# Patient Record
Sex: Male | Born: 1995 | Race: Black or African American | Hispanic: No | Marital: Single
Health system: Southern US, Community
[De-identification: ages and names within clinical notes are randomized; demographics above are authoritative.]

---

## 2017-07-10 ENCOUNTER — Encounter (HOSPITAL_COMMUNITY): Payer: Self-pay | Admitting: Emergency Medicine

## 2017-07-10 ENCOUNTER — Emergency Department (HOSPITAL_COMMUNITY)
Admission: EM | Admit: 2017-07-10 | Discharge: 2017-07-10 | Disposition: A | Discharge status: Home / Self Care | Payer: Medicaid Other | Attending: Emergency Medicine | Admitting: Emergency Medicine

## 2017-07-10 ENCOUNTER — Other Ambulatory Visit: Payer: Self-pay

## 2017-07-10 DIAGNOSIS — Z5321 Procedure and treatment not carried out due to patient leaving prior to being seen by health care provider: Secondary | ICD-10-CM | POA: Insufficient documentation

## 2017-07-10 DIAGNOSIS — M79602 Pain in left arm: Secondary | ICD-10-CM | POA: Diagnosis not present

## 2017-07-10 NOTE — ED Triage Notes (Signed)
Pt reports that when he left work his right arm began to hurt, no obvious injuries to the arm.  No swelling noted at this time.   He does "cut meat" for a living.

## 2019-05-07 ENCOUNTER — Encounter: Payer: Self-pay | Admitting: Cardiovascular Disease

## 2021-02-17 ENCOUNTER — Other Ambulatory Visit: Payer: Self-pay

## 2021-02-17 ENCOUNTER — Encounter (HOSPITAL_COMMUNITY): Payer: Self-pay | Admitting: *Deleted

## 2021-02-17 ENCOUNTER — Emergency Department (HOSPITAL_COMMUNITY): Payer: Self-pay

## 2021-02-17 ENCOUNTER — Emergency Department (HOSPITAL_COMMUNITY)
Admission: EM | Admit: 2021-02-17 | Discharge: 2021-02-17 | Disposition: A | Discharge status: Home / Self Care | Payer: Self-pay | Attending: Emergency Medicine | Admitting: Emergency Medicine

## 2021-02-17 DIAGNOSIS — R509 Fever, unspecified: Secondary | ICD-10-CM

## 2021-02-17 DIAGNOSIS — J09X2 Influenza due to identified novel influenza A virus with other respiratory manifestations: Secondary | ICD-10-CM | POA: Insufficient documentation

## 2021-02-17 DIAGNOSIS — J101 Influenza due to other identified influenza virus with other respiratory manifestations: Secondary | ICD-10-CM

## 2021-02-17 DIAGNOSIS — Z20822 Contact with and (suspected) exposure to covid-19: Secondary | ICD-10-CM | POA: Insufficient documentation

## 2021-02-17 LAB — RESP PANEL BY RT-PCR (FLU A&B, COVID) ARPGX2
Influenza A by PCR: POSITIVE — AB
Influenza B by PCR: NEGATIVE
SARS Coronavirus 2 by RT PCR: NEGATIVE

## 2021-02-17 MED ORDER — OSELTAMIVIR PHOSPHATE 75 MG PO CAPS: 75.0000 mg | ORAL_CAPSULE | Freq: Two times a day (BID) | ORAL | 0 refills | Status: AC

## 2021-02-17 MED ORDER — ACETAMINOPHEN 325 MG PO TABS
650.0000 mg | ORAL_TABLET | Freq: Once | ORAL | Status: AC | PRN
  Administered 2021-02-17: 650 mg via ORAL
  Filled 2021-02-17: qty 2

## 2021-02-17 NOTE — ED Triage Notes (Signed)
Pt arrived with [facility] for fever, cough, cp when coughing, and headache since Wednesday night. Denies covid exposure

## 2021-02-17 NOTE — ED Notes (Signed)
Pt noted to have bp readings 81 systolic, reports feeling lightheaded.

## 2021-02-17 NOTE — Discharge Instructions (Addendum)
You are positive for flu A.  Continue taking Tylenol and Motrin for fever control as discussed.  I have prescribed Tamiflu for you to take to decrease the duration of your symptoms.  Drink plenty of fluids. Water and/or Gatorade zero. If you develop nausea vomiting and are unable to keep any food down please return to the emergency room.

## 2021-02-17 NOTE — ED Provider Notes (Signed)
[facility] EMERGENCY DEPARTMENT Provider Note   CSN: [id number] Arrival date & time: 02/17/21  8338     History Chief Complaint  Patient presents with   Fever    Joe Harding is a 25 y.o. male.  25 year old male presents today for evaluation of fever and chest pain of 2-day duration.  Patient reports Wednesday night he had symptom onset with cough and fever have persistent since and yesterday developed chest pain causing him to present to the emergency room.  He has tried over-the-counter ibuprofen without much relief.  Denies nausea, vomiting, abdominal pain, shortness of breath, lightheadedness.  Reports he he has good appetite and has been drinking adequate fluid.  Reports his chest pain is worse with movement and cough.  Denies constant chest pain.  Chest pain does not radiate anywhere.  He denies recent known sick contacts.  The history is provided by the patient. No language interpreter was used.      History reviewed. No pertinent past medical history.  There are no problems to display for this patient.   History reviewed. No pertinent surgical history.     No family history on file.     Home Medications Prior to Admission medications   Medication Sig Start Date End Date Taking? Authorizing Provider  oseltamivir (TAMIFLU) 75 MG capsule Take 1 capsule (75 mg total) by mouth every 12 (twelve) hours. 02/17/21  Yes Marita Kansas, PA-C    Allergies    Patient has no known allergies.  Review of Systems   Review of Systems  Constitutional:  Positive for chills and fever. Negative for activity change, appetite change and fatigue.  HENT:  Negative for congestion and sore throat.   Eyes:  Negative for visual disturbance.  Respiratory:  Negative for shortness of breath.   Cardiovascular:  Positive for chest pain (With cough and motion). Negative for palpitations.  Gastrointestinal:  Negative for abdominal pain, diarrhea, nausea and vomiting.   Neurological:  Negative for weakness and light-headedness.  All other systems reviewed and are negative.  Physical Exam Updated Vital Signs BP 99/61   Pulse 91   Temp (!) 101.7 F (38.7 C)   Resp 17   SpO2 96%   Physical Exam Vitals and nursing note reviewed.  Constitutional:      General: He is not in acute distress.    Appearance: Normal appearance. He is not ill-appearing.  HENT:     Head: Normocephalic and atraumatic.     Nose: Nose normal.  Eyes:     General: No scleral icterus.    Extraocular Movements: Extraocular movements intact.     Conjunctiva/sclera: Conjunctivae normal.  Cardiovascular:     Rate and Rhythm: Regular rhythm. Tachycardia present.     Pulses: Normal pulses.     Heart sounds: Normal heart sounds.  Pulmonary:     Effort: Pulmonary effort is normal. No respiratory distress.     Breath sounds: Normal breath sounds. No wheezing or rales.  Abdominal:     General: There is no distension.     Tenderness: There is no abdominal tenderness.  Musculoskeletal:        General: Normal range of motion.     Cervical back: Normal range of motion.  Skin:    General: Skin is warm and dry.  Neurological:     General: No focal deficit present.     Mental Status: He is alert. Mental status is at baseline.    ED Results / Procedures / Treatments  Labs (all labs ordered are listed, but only abnormal results are displayed) Labs Reviewed  RESP PANEL BY RT-PCR (FLU A&B, COVID) ARPGX2 - Abnormal; Notable for the following components:      Result Value   Influenza A by PCR POSITIVE (*)    All other components within normal limits    EKG None  Radiology DG Chest Port 1 View  Result Date: 02/17/2021 CLINICAL DATA:  Fever EXAM: PORTABLE CHEST 1 VIEW COMPARISON:  None. FINDINGS: Normal mediastinum and cardiac silhouette. Normal pulmonary vasculature. No evidence of effusion, infiltrate, or pneumothorax. No acute bony abnormality. IMPRESSION: Normal chest  radiograph. Electronically Signed   By: Genevive Bi M.D.   On: 02/17/2021 07:28    Procedures Procedures   Medications Ordered in ED Medications  acetaminophen (TYLENOL) tablet 650 mg (650 mg Oral Given 02/17/21 8756)    ED Course  I have reviewed the triage vital signs and the nursing notes.  Pertinent labs & imaging results that were available during my care of the patient were reviewed by me and considered in my medical decision making (see chart for details).    MDM Rules/Calculators/A&P                           25 year old male presents today for evaluation of cough, fever, chest pain.  EKG with sinus tachycardia without acute ischemic changes.  Patient tested positive for influenza A.  Fever improved following Tylenol.  Patient is comfortable on exam.  Given his presentation chest pain is unlikely to be ACS.  Is normal EKG and worse with cough and exertion. Without nausea and able to tolerate p.o. fluids.  Discussed supportive care with Tylenol, Motrin for fever control, and adequate fluid intake.  We will send in Tamiflu given he is within 48-hour window of symptom onset.  Patient voices understanding and is in agreement with plan.  Final Clinical Impression(s) / ED Diagnoses Final diagnoses:  Influenza A    Rx / DC Orders ED Discharge Orders          Ordered    oseltamivir (TAMIFLU) 75 MG capsule  Every 12 hours        02/17/21 0811             Marita Kansas, PA-C 02/17/21 4332    Virgina Norfolk, DO 02/17/21 [phone]

## 2022-11-15 IMAGING — DX DG CHEST 1V PORT
1 series · 1 of 1 positions shown · non-contrast
Comparison: None.

CLINICAL DATA: Fever

EXAM:
PORTABLE CHEST 1 VIEW

[chest ap]
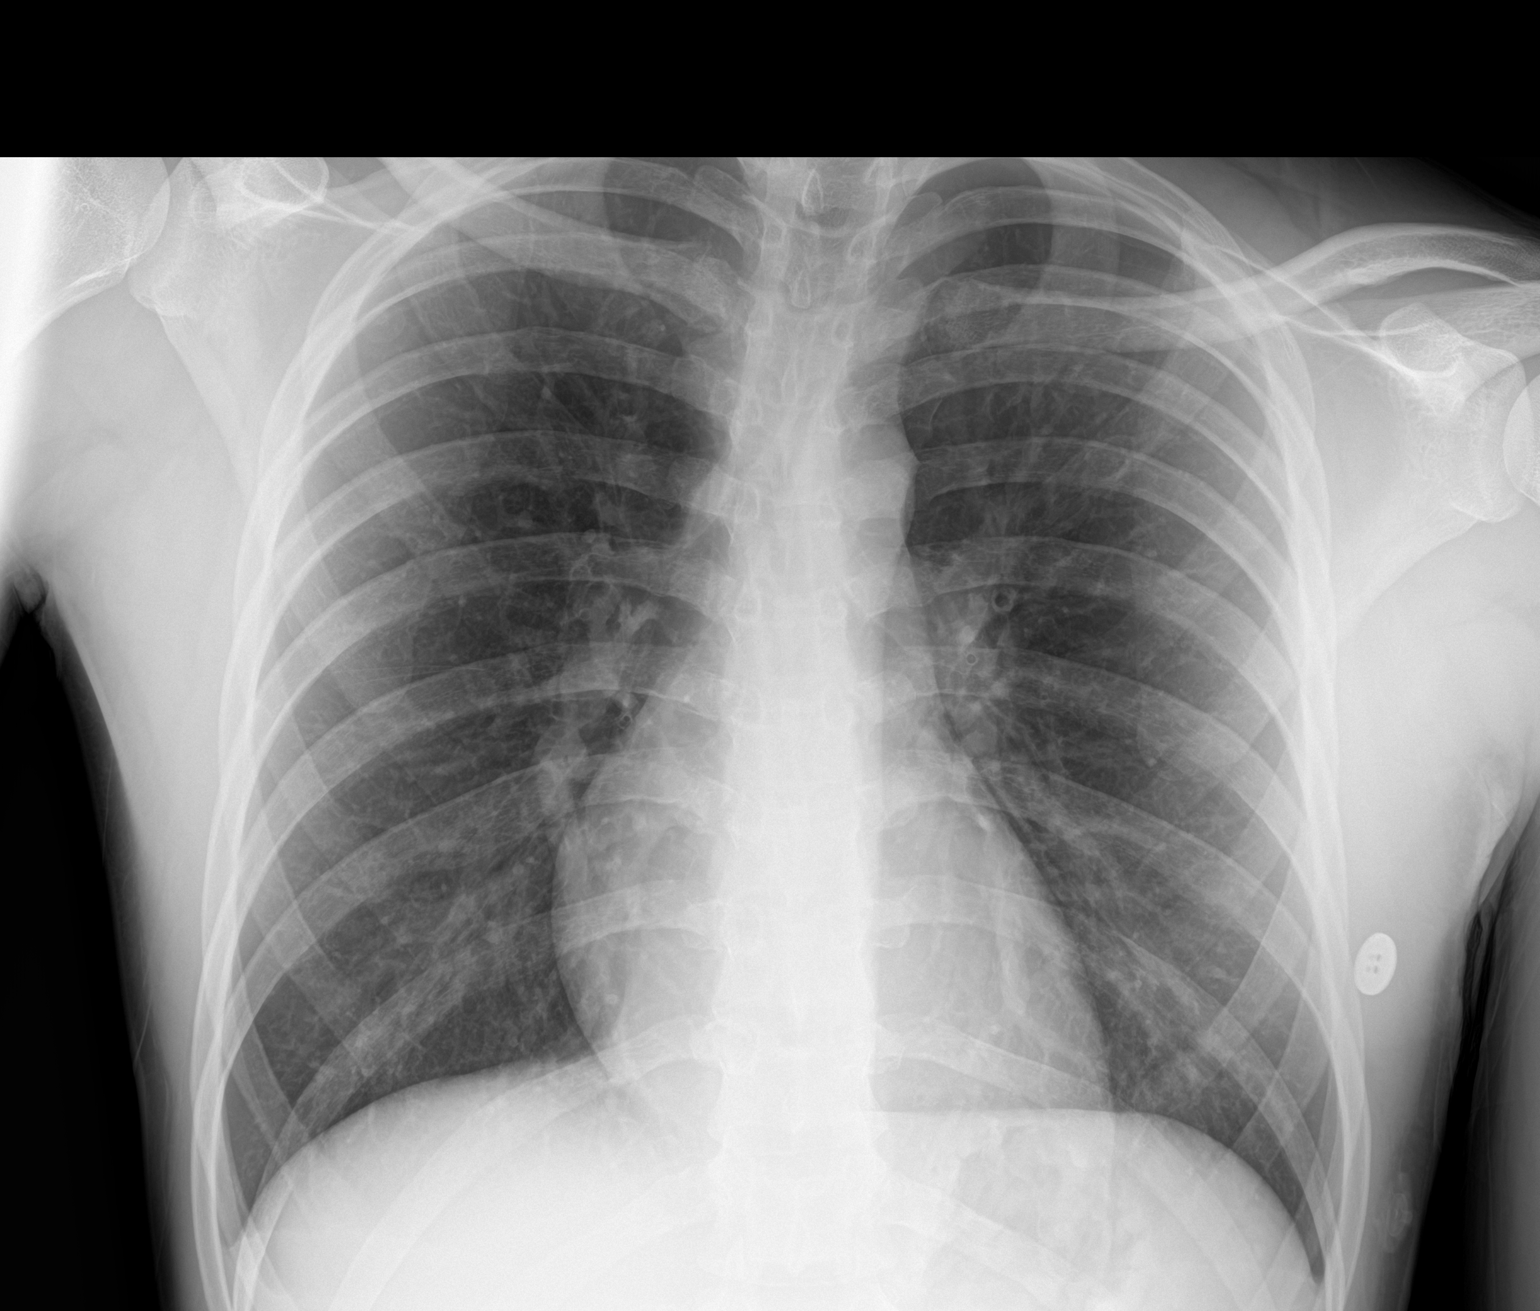

[1 of 1 positions shown; findings below may reference images not displayed]

FINDINGS: Normal mediastinum and cardiac silhouette. Normal pulmonary
vasculature. No evidence of effusion, infiltrate, or pneumothorax.
No acute bony abnormality.
IMPRESSION: Normal chest radiograph.
